# Patient Record
Sex: Female | Born: 1950 | Race: White | Hispanic: No | Marital: Married | Smoking: Never smoker
Health system: Southern US, Community
[De-identification: ages and names within clinical notes are randomized; demographics above are authoritative.]

## PROBLEM LIST (undated history)

## (undated) DIAGNOSIS — K219 Gastro-esophageal reflux disease without esophagitis: Secondary | ICD-10-CM

## (undated) DIAGNOSIS — I10 Essential (primary) hypertension: Secondary | ICD-10-CM

## (undated) HISTORY — PX: ABDOMINAL HYSTERECTOMY: SHX81

---

## 2016-09-06 ENCOUNTER — Emergency Department (HOSPITAL_COMMUNITY): Payer: Medicare Other

## 2016-09-06 ENCOUNTER — Emergency Department (HOSPITAL_COMMUNITY)
Admission: EM | Admit: 2016-09-06 | Discharge: 2016-09-06 | Disposition: A | Discharge status: Home / Self Care | Payer: Medicare Other | Attending: Emergency Medicine | Admitting: Emergency Medicine

## 2016-09-06 ENCOUNTER — Encounter (HOSPITAL_COMMUNITY): Payer: Self-pay

## 2016-09-06 DIAGNOSIS — Y92512 Supermarket, store or market as the place of occurrence of the external cause: Secondary | ICD-10-CM | POA: Diagnosis not present

## 2016-09-06 DIAGNOSIS — Y999 Unspecified external cause status: Secondary | ICD-10-CM | POA: Diagnosis not present

## 2016-09-06 DIAGNOSIS — W1849XA Other slipping, tripping and stumbling without falling, initial encounter: Secondary | ICD-10-CM | POA: Insufficient documentation

## 2016-09-06 DIAGNOSIS — Y939 Activity, unspecified: Secondary | ICD-10-CM | POA: Diagnosis not present

## 2016-09-06 DIAGNOSIS — S86001A Unspecified injury of right Achilles tendon, initial encounter: Secondary | ICD-10-CM | POA: Diagnosis not present

## 2016-09-06 DIAGNOSIS — S99911A Unspecified injury of right ankle, initial encounter: Secondary | ICD-10-CM | POA: Diagnosis present

## 2016-09-06 DIAGNOSIS — I10 Essential (primary) hypertension: Secondary | ICD-10-CM | POA: Insufficient documentation

## 2016-09-06 DIAGNOSIS — Z7984 Long term (current) use of oral hypoglycemic drugs: Secondary | ICD-10-CM | POA: Diagnosis not present

## 2016-09-06 DIAGNOSIS — Z7982 Long term (current) use of aspirin: Secondary | ICD-10-CM | POA: Insufficient documentation

## 2016-09-06 HISTORY — DX: Essential (primary) hypertension: I10

## 2016-09-06 HISTORY — DX: Gastro-esophageal reflux disease without esophagitis: K21.9

## 2016-09-06 MED ORDER — DIAZEPAM 5 MG PO TABS: 5.0000 mg | ORAL_TABLET | Freq: Two times a day (BID) | ORAL | 0 refills | Status: AC

## 2016-09-06 MED ORDER — OXYCODONE-ACETAMINOPHEN 5-325 MG PO TABS: 1.0000 | ORAL_TABLET | ORAL | 0 refills | Status: AC | PRN

## 2016-09-06 MED ORDER — MORPHINE SULFATE (PF) 4 MG/ML IV SOLN
4.0000 mg | Freq: Once | INTRAVENOUS | Status: AC
Start: 2016-09-06 — End: 2016-09-06
  Administered 2016-09-06: 4 mg via INTRAVENOUS
  Filled 2016-09-06: qty 1

## 2016-09-06 MED ORDER — IBUPROFEN 600 MG PO TABS: 600.0000 mg | ORAL_TABLET | Freq: Four times a day (QID) | ORAL | 0 refills | Status: AC | PRN

## 2016-09-06 MED ORDER — OXYCODONE-ACETAMINOPHEN 5-325 MG PO TABS
1.0000 | ORAL_TABLET | Freq: Once | ORAL | Status: AC
  Administered 2016-09-06: 1 via ORAL
  Filled 2016-09-06: qty 1

## 2016-09-06 MED ORDER — ONDANSETRON HCL 4 MG/2ML IJ SOLN
4.0000 mg | Freq: Once | INTRAMUSCULAR | Status: AC
  Administered 2016-09-06: 4 mg via INTRAVENOUS
  Filled 2016-09-06: qty 2

## 2016-09-06 MED ORDER — DIAZEPAM 5 MG PO TABS
5.0000 mg | ORAL_TABLET | Freq: Once | ORAL | Status: AC
  Administered 2016-09-06: 5 mg via ORAL
  Filled 2016-09-06: qty 1

## 2016-09-06 MED ORDER — LORAZEPAM 2 MG/ML IJ SOLN
1.0000 mg | Freq: Once | INTRAMUSCULAR | Status: AC
  Administered 2016-09-06: 1 mg via INTRAVENOUS
  Filled 2016-09-06: qty 1

## 2016-09-06 NOTE — ED Notes (Signed)
Bed: ZO10 Expected date:  Expected time:  Means of arrival:  Comments: EMS fall ankle pain, IV established

## 2016-09-06 NOTE — ED Provider Notes (Signed)
[facility]-EMERGENCY DEPT Provider Note   CSN: [id number] Arrival date & time: 09/06/16  1648     History   Chief Complaint Chief Complaint  Patient presents with  . fall and right ankle injury    HPI Michelle Boyer is a 66 y.o. female.  Pt presents to the ED today with right achilles tendon pain.  She slipped on uneven wood while at the [employer] and twisted her right ankle.  The pt was unable to walk.  EMS brought her here.  She was given 200 mcg of fentanyl en route.  Pt denies any other injury.      Past Medical History:  Diagnosis Date  . Acid reflux disease   . Hypertension     There are no active problems to display for this patient.   Past Surgical History:  Procedure Laterality Date  . ABDOMINAL HYSTERECTOMY      OB History    No data available       Home Medications    Prior to Admission medications   Medication Sig Start Date End Date Taking? Authorizing Provider  amLODipine (NORVASC) 5 MG tablet Take 5 mg by mouth daily.   Yes Historical Provider, MD  aspirin EC 81 MG tablet Take 81 mg by mouth daily.   Yes Historical Provider, MD  cholecalciferol (VITAMIN D) 1000 units tablet Take 1,000 Units by mouth daily.   Yes Historical Provider, MD  metFORMIN (GLUCOPHAGE) 500 MG tablet Take 500 mg by mouth 2 (two) times daily with a meal.   Yes Historical Provider, MD  Multiple Vitamins-Minerals (MULTIVITAMIN ADULTS) TABS Take 1 tablet by mouth daily.   Yes Historical Provider, MD  pantoprazole (PROTONIX) 40 MG tablet Take 40 mg by mouth daily.   Yes Historical Provider, MD  pravastatin (PRAVACHOL) 40 MG tablet Take 40 mg by mouth daily.   Yes Historical Provider, MD  diazepam (VALIUM) 5 MG tablet Take 1 tablet (5 mg total) by mouth 2 (two) times daily. 09/06/16   Jacalyn Lefevre, MD  ibuprofen (ADVIL,MOTRIN) 600 MG tablet Take 1 tablet (600 mg total) by mouth every 6 (six) hours as needed. 09/06/16   Jacalyn Lefevre, MD  oxyCODONE-acetaminophen  (PERCOCET/ROXICET) 5-325 MG tablet Take 1-2 tablets by mouth every 4 (four) hours as needed for severe pain. 09/06/16   Jacalyn Lefevre, MD    Family History No family history on file.  Social History Social History  Substance Use Topics  . Smoking status: Never Smoker  . Smokeless tobacco: Never Used  . Alcohol use Yes     Allergies   Patient has no known allergies.   Review of Systems Review of Systems  Musculoskeletal:       Right ankle pain  All other systems reviewed and are negative.    Physical Exam Updated Vital Signs BP (!) 146/83   Pulse 71   Temp 97.8 F (36.6 C) (Oral)   Resp 20   Ht  (1.676 m)   Wt 185 lb (83.9 kg)   LMP  (LMP Unknown)   SpO2 93%   BMI 29.86 kg/m   Physical Exam  Constitutional: She is oriented to person, place, and time. She appears well-developed and well-nourished.  HENT:  Head: Normocephalic and atraumatic.  Right Ear: External ear normal.  Left Ear: External ear normal.  Nose: Nose normal.  Mouth/Throat: Oropharynx is clear and moist.  Eyes: Conjunctivae and EOM are normal. Pupils are equal, round, and reactive to light.  Neck:  Normal range of motion. Neck supple.  Cardiovascular: Normal rate, regular rhythm, normal heart sounds and intact distal pulses.   Pulmonary/Chest: Effort normal and breath sounds normal.  Abdominal: Soft. Bowel sounds are normal.  Musculoskeletal:  Achilles tendon and lower calf pain.  Thompson's test positive on right  Neurological: She is alert and oriented to person, place, and time.  Psychiatric: She has a normal mood and affect. Her behavior is normal. Judgment and thought content normal.  Nursing note and vitals reviewed.    ED Treatments / Results  Labs (all labs ordered are listed, but only abnormal results are displayed) Labs Reviewed - No data to display  EKG  EKG Interpretation None       Radiology Dg Ankle Complete Right  Result Date: 09/06/2016 CLINICAL DATA:  66  y/o F; severe right ankle pain after slip on hardwood floor. EXAM: RIGHT ANKLE - COMPLETE 3+ VIEW COMPARISON:  None. FINDINGS: No acute fracture or dislocation identified. Talar dome is intact. Ankle mortise is symmetric on these nonstress views. Well corticated ossific bodies adjacent to the medial malleolus probably represents sequelae of old trauma or tendinosis. Mild soft tissue swelling about the ankle joint. Dorsal calcaneal enthesophyte. IMPRESSION: No acute fracture or dislocation identified. Mild soft tissue swelling about the ankle joint. Electronically Signed   By: Mitzi Hansen M.D.   On: 09/06/2016 17:50    Procedures Procedures (including critical care time)  Medications Ordered in ED Medications  oxyCODONE-acetaminophen (PERCOCET/ROXICET) 5-325 MG per tablet 1 tablet (not administered)  diazepam (VALIUM) tablet 5 mg (not administered)  morphine 4 MG/ML injection 4 mg (4 mg Intravenous Given 09/06/16 1742)  ondansetron (ZOFRAN) injection 4 mg (4 mg Intravenous Given 09/06/16 1742)  LORazepam (ATIVAN) injection 1 mg (1 mg Intravenous Given 09/06/16 1910)     Initial Impression / Assessment and Plan / ED Course  I have reviewed the triage vital signs and the nursing notes.  Pertinent labs & imaging results that were available during my care of the patient were reviewed by me and considered in my medical decision making (see chart for details).     I strongly suspect achilles tendon rupture.  She was placed in a walking boot.  She was able to ambulate.  She lives in [place] and knows to f/u with ortho when she gets home.  She knows to return if worse.  Final Clinical Impressions(s) / ED Diagnoses   Final diagnoses:  Achilles tendon injury, right, initial encounter    New Prescriptions New Prescriptions   DIAZEPAM (VALIUM) 5 MG TABLET    Take 1 tablet (5 mg total) by mouth 2 (two) times daily.   IBUPROFEN (ADVIL,MOTRIN) 600 MG TABLET    Take 1 tablet (600 mg total) by  mouth every 6 (six) hours as needed.   OXYCODONE-ACETAMINOPHEN (PERCOCET/ROXICET) 5-325 MG TABLET    Take 1-2 tablets by mouth every 4 (four) hours as needed for severe pain.     Jacalyn Lefevre, MD 09/06/16 [phone]

## 2016-09-06 NOTE — ED Triage Notes (Signed)
Pt coming from [employer] tripped on uneven wood and twisted her right ankle. Right ankle deformed and swollen. She did not hit her head. Pt denies being on blood thinners. Patient  complain of  4/10.  Pt received  200 mcg fentanyl by ems.

## 2018-03-20 IMAGING — DX DG ANKLE COMPLETE 3+V*R*
3 series · 3 of 3 positions shown · non-contrast
Comparison: None.

CLINICAL DATA: 65 y/o F; severe right ankle pain after slip on
hardwood floor.

EXAM:
RIGHT ANKLE - COMPLETE 3+ VIEW

[ankle ap]
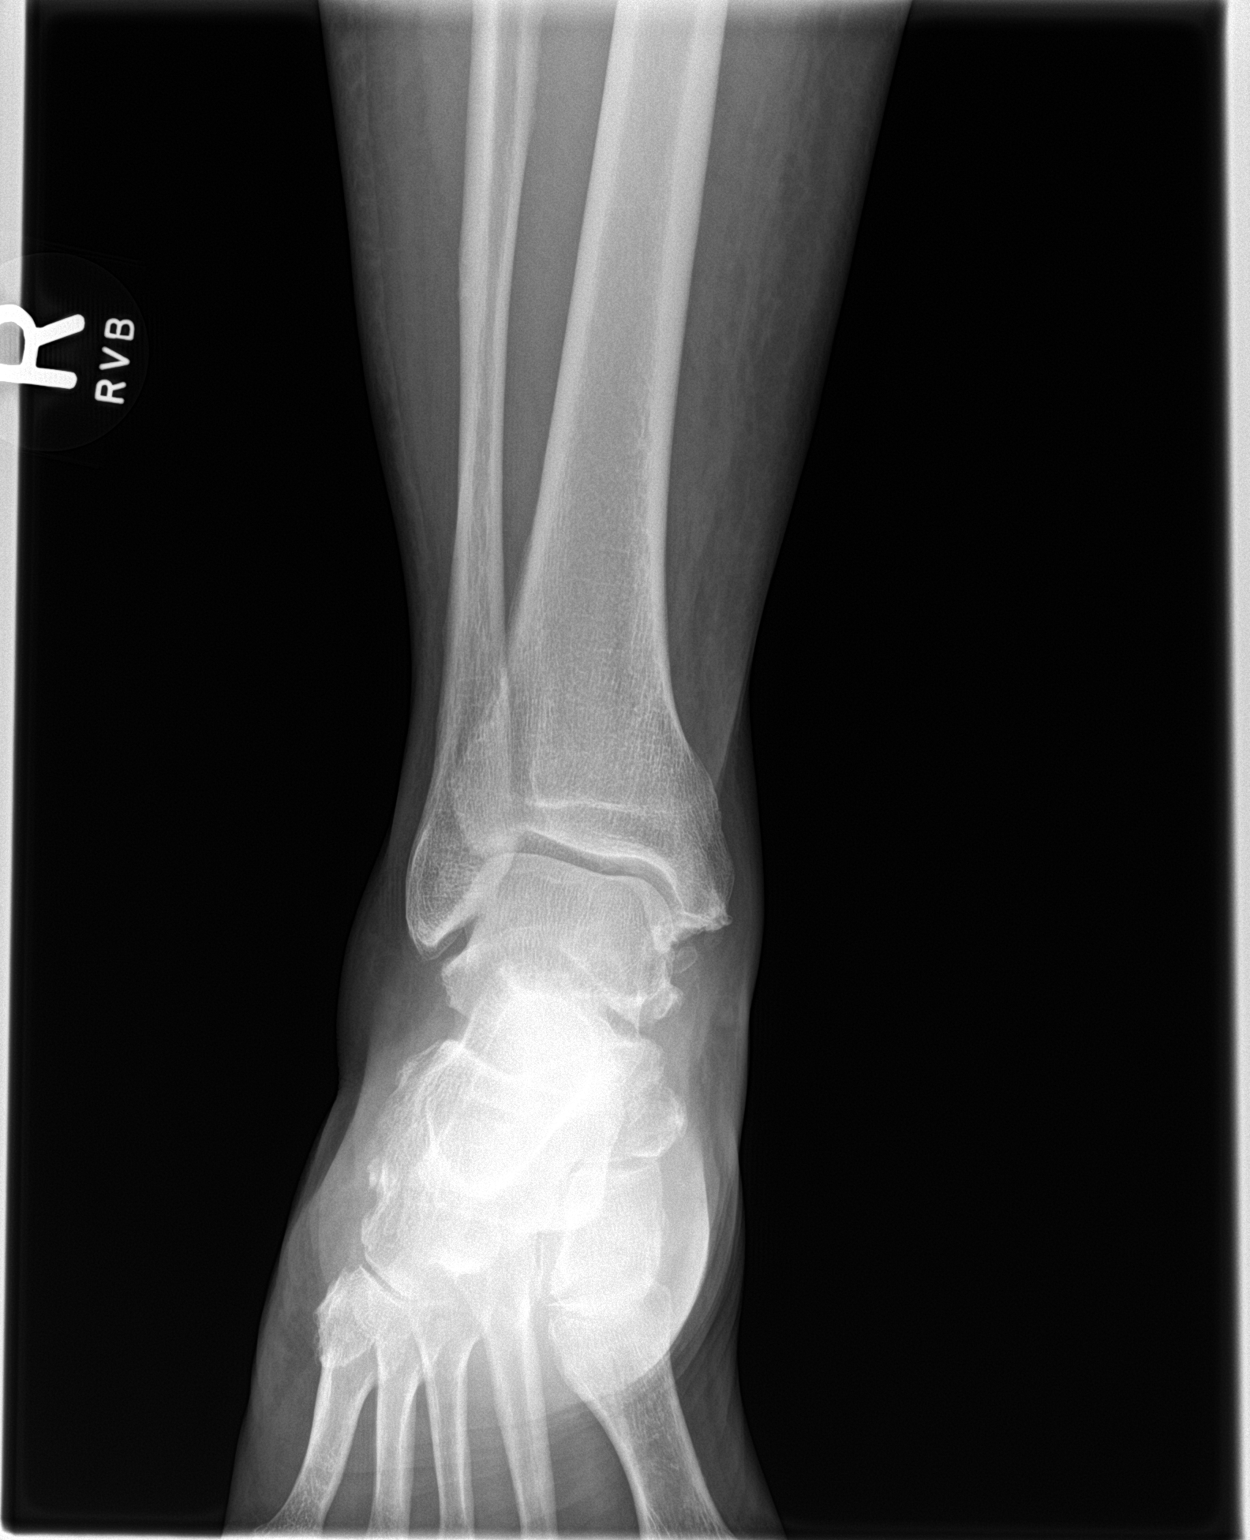

[ankle obl]
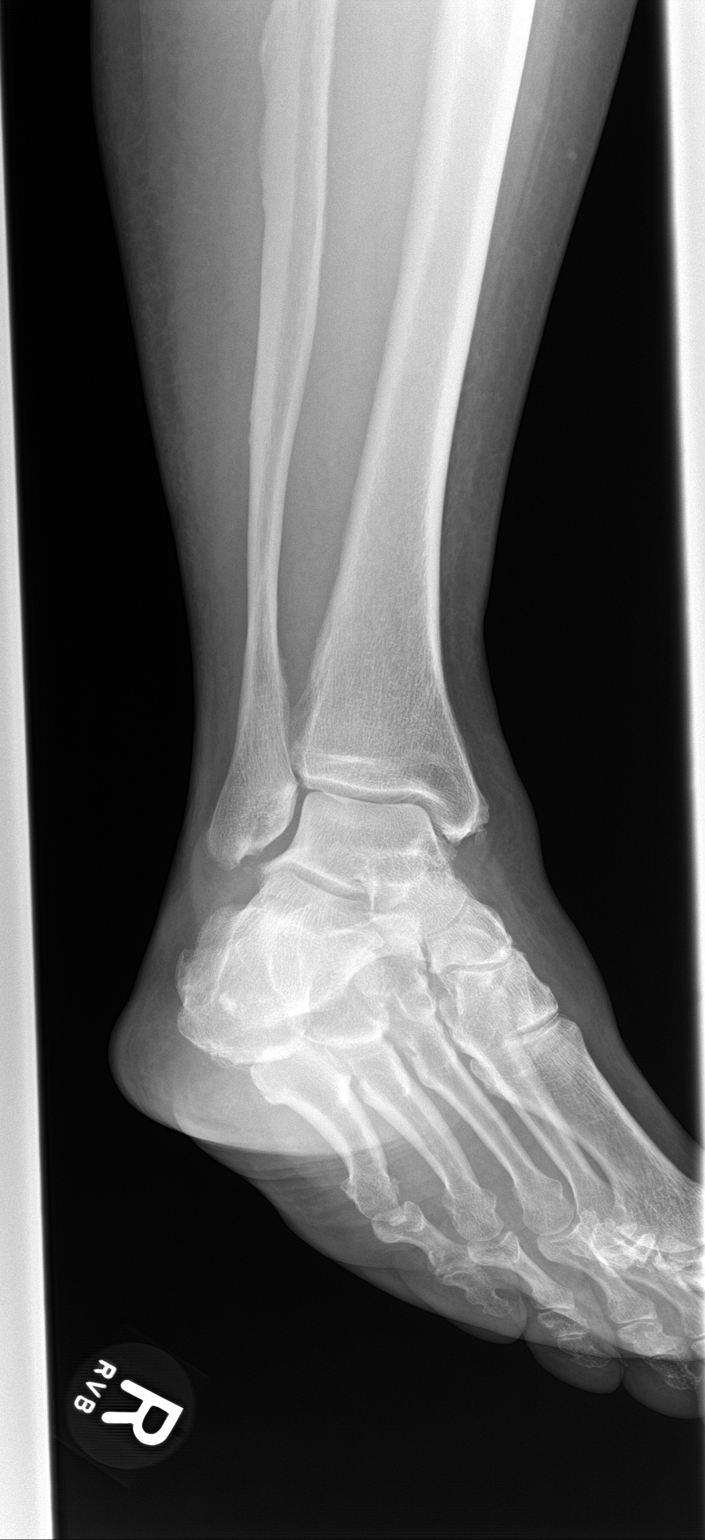

[ankle lat]
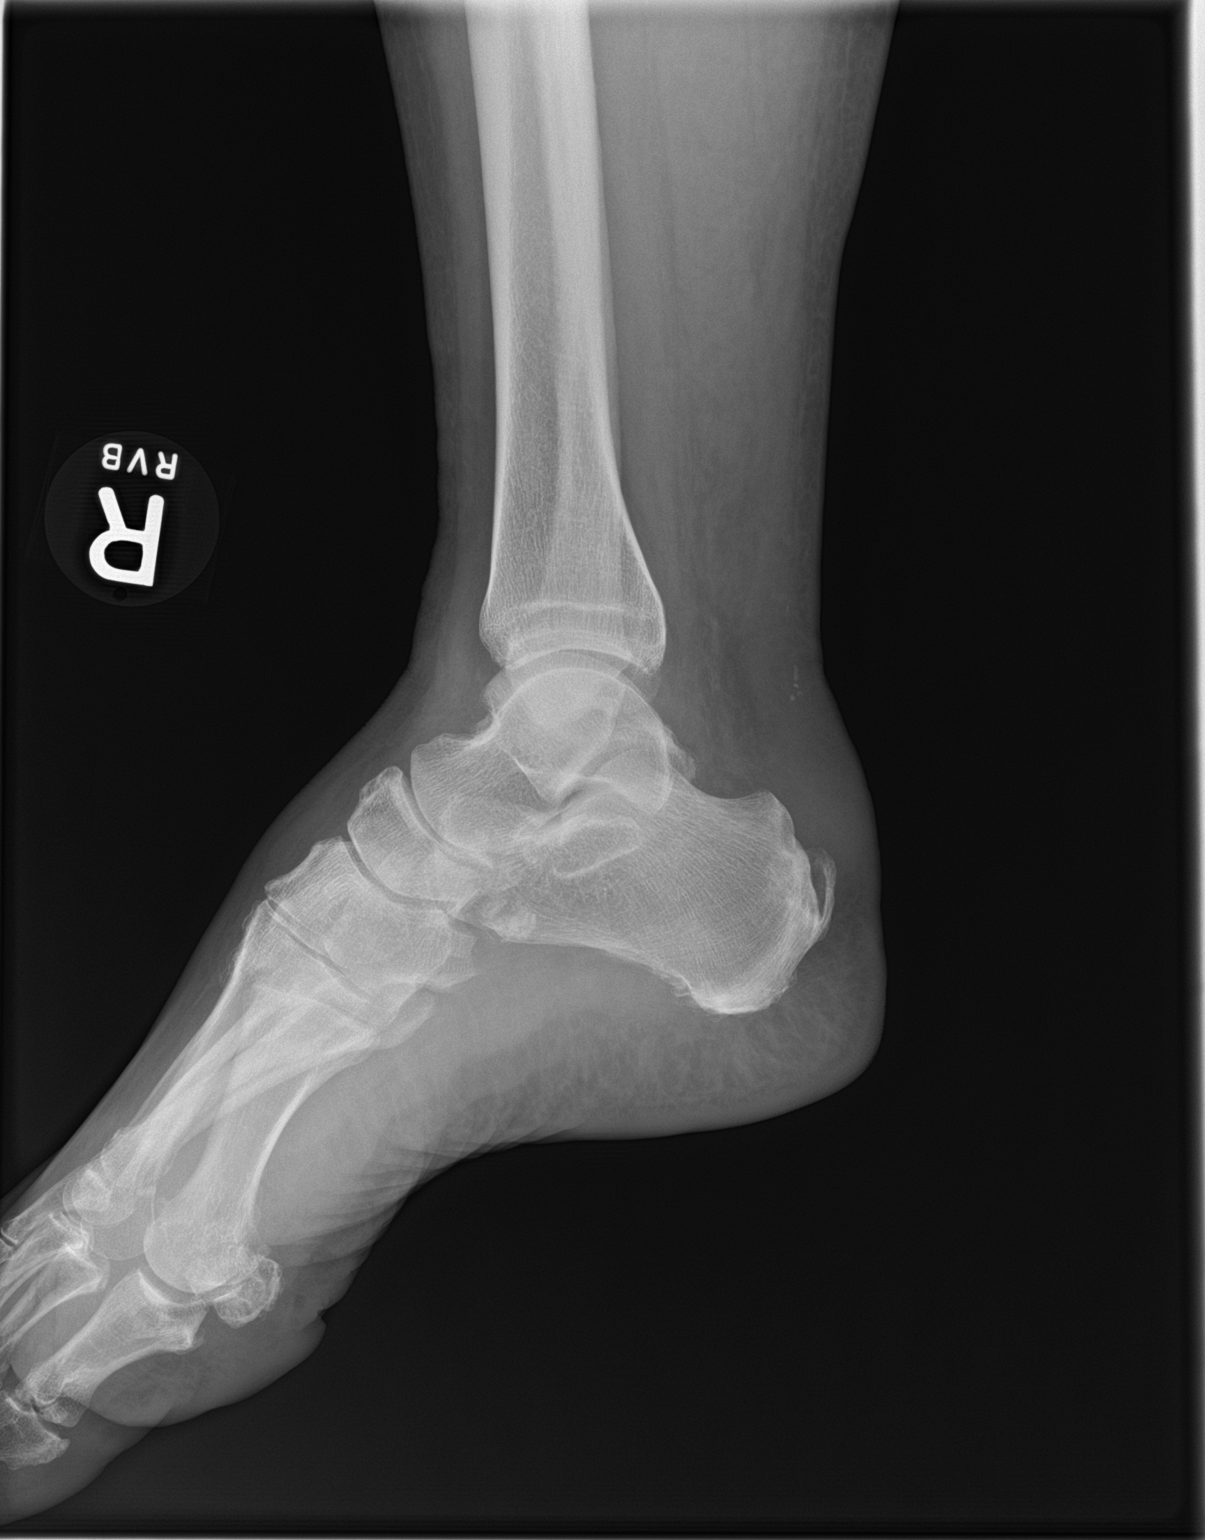

[3 of 3 positions shown; findings below may reference images not displayed]

FINDINGS: No acute fracture or dislocation identified. Talar dome is intact.
Ankle mortise is symmetric on these nonstress views. Well corticated
ossific bodies adjacent to the medial malleolus probably represents
sequelae of old trauma or tendinosis. Mild soft tissue swelling
about the ankle joint. Dorsal calcaneal enthesophyte.
IMPRESSION: No acute fracture or dislocation identified. Mild soft tissue
swelling about the ankle joint.

By: Jhonatan Depaz M.D.
